# Patient Record
Sex: Female | Born: 1990 | Race: Black or African American | Hispanic: No | Marital: Single
Health system: Southern US, Community
[De-identification: ages and names within clinical notes are randomized; demographics above are authoritative.]

---
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
## 2012-03-04 IMAGING — CT CT HEAD WITHOUT CONTRAST
2 series · 16 of 30 positions shown, 20 images · non-contrast
Comparison: none

REASON FOR EXAM: severe headache
COMMENTS:

PROCEDURE:     CT  - CT HEAD WITHOUT CONTRAST  - November 15, 2010  [DATE]
RESULT:     Technique: Helical 5mm sections were obtained from the skull
base to the vertex without administration of intravenous contrast.

[Series 7: without · axial · non-contrast · 0.41mm/px · z∈[-220,-100]mm · 13 of 28 slices shown, 17 images]
[im 2/28  brain]
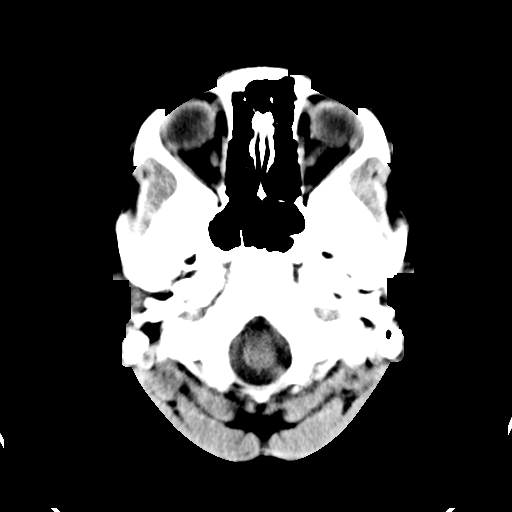
[im 2/28  bone]
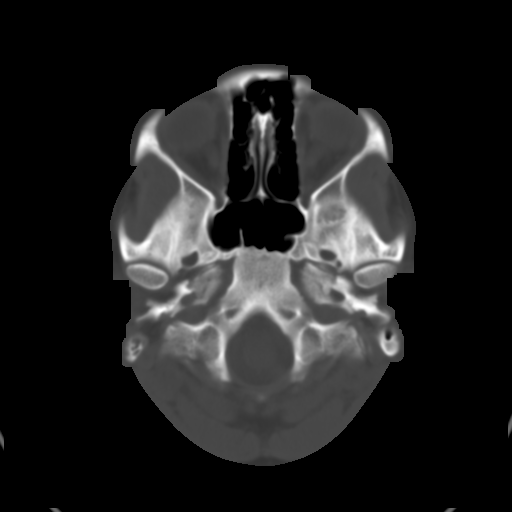
[im 4/28  brain]
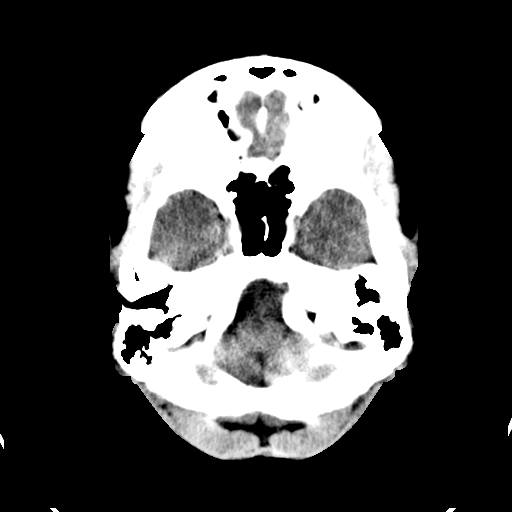
[im 6/28  brain]
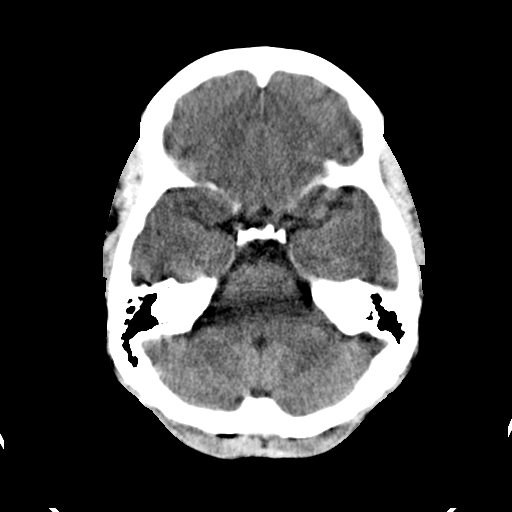
[im 8/28  brain]
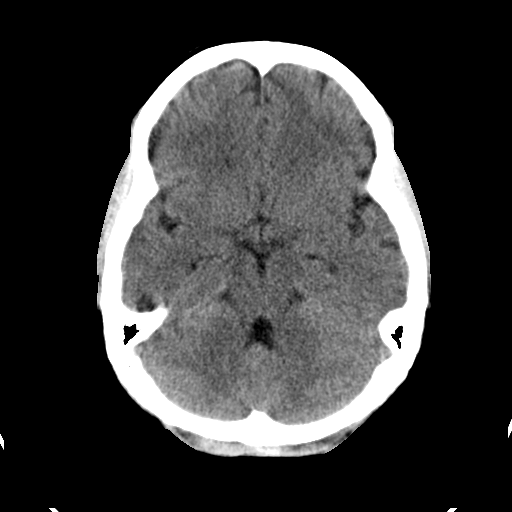
[im 10/28  brain]
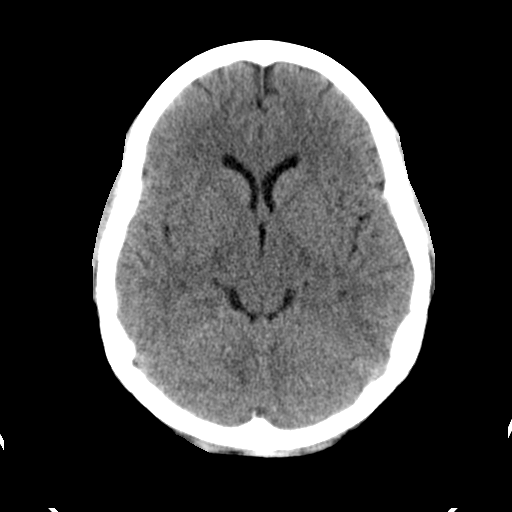
[im 10/28  bone]
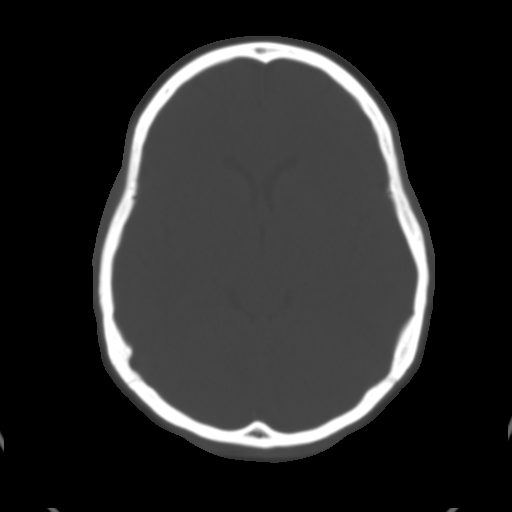
[im 12/28  brain]
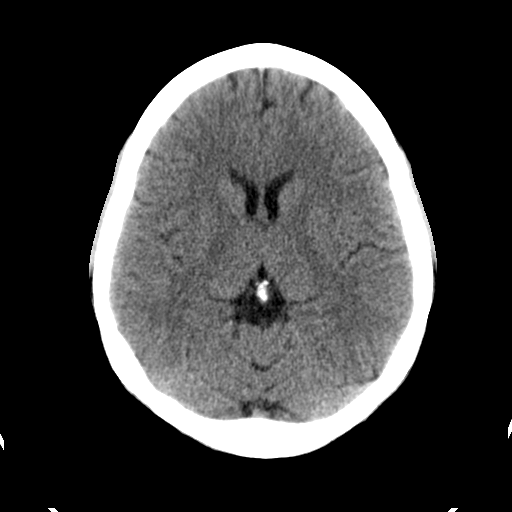
[im 14/28  brain]
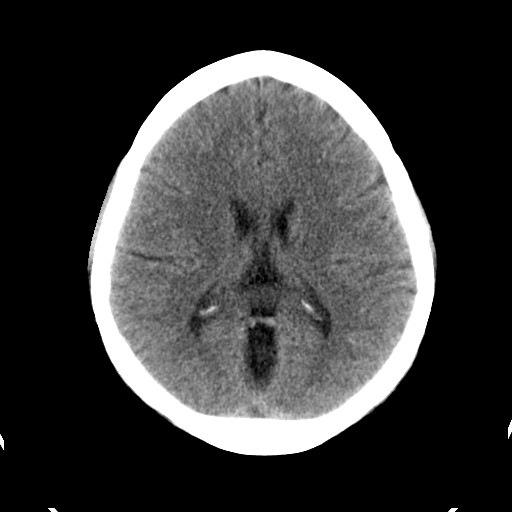
[im 16/28  brain]
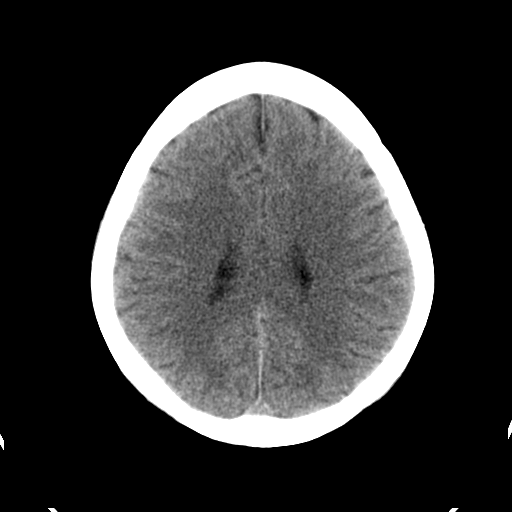
[im 18/28  brain]
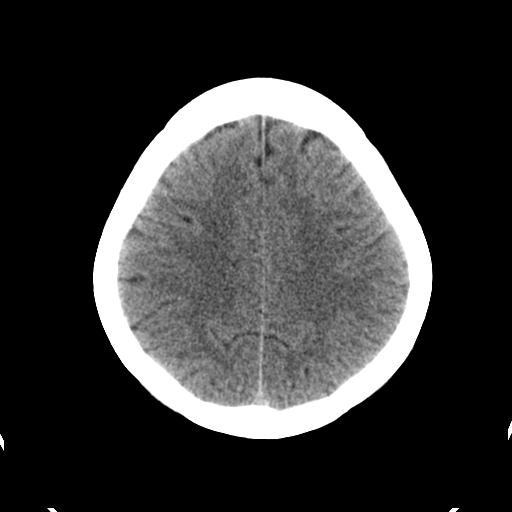
[im 18/28  bone]
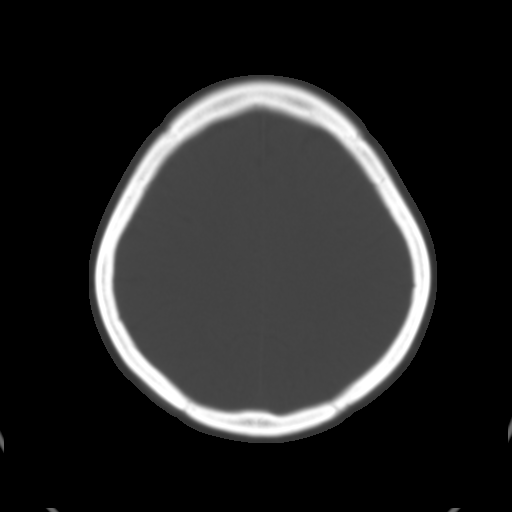
[im 20/28  brain]
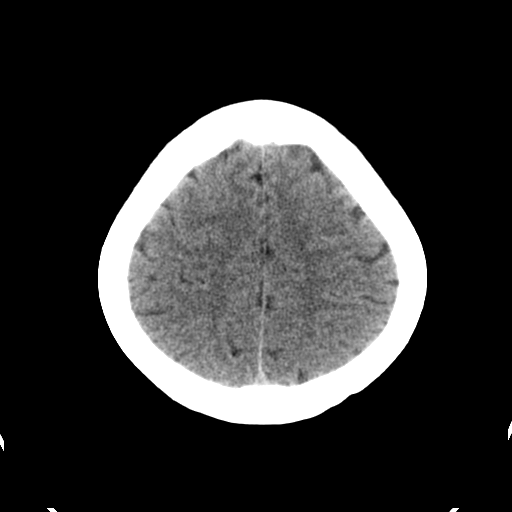
[im 22/28  brain]
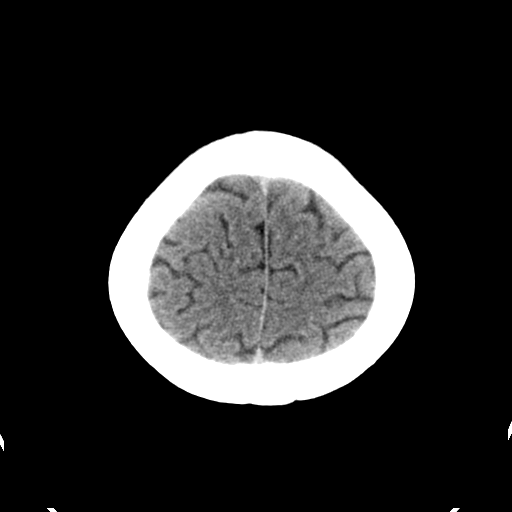
[im 24/28  brain]
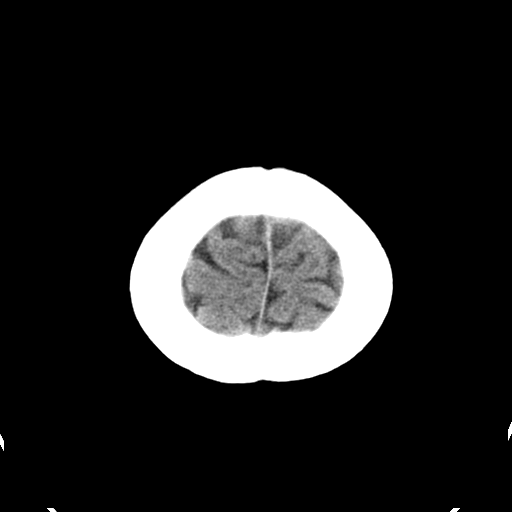
[im 26/28  brain]
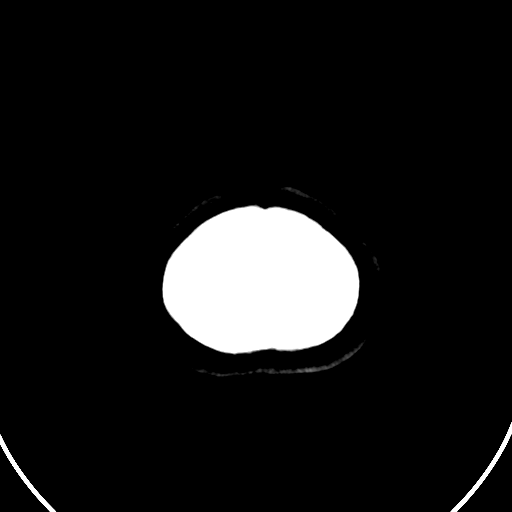
[im 26/28  bone]
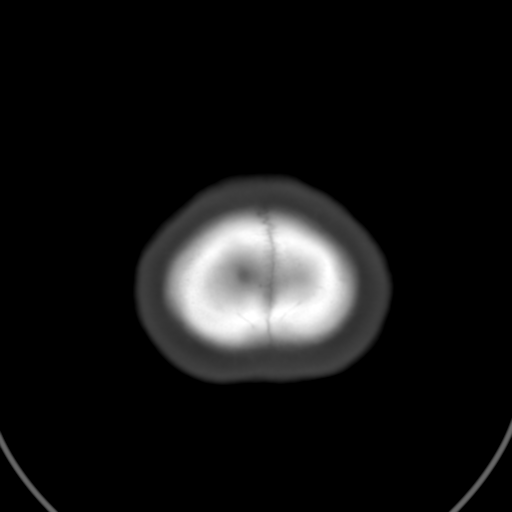

[Series 8: bone · axial · 0.41mm/px · z∈[-220,-180]mm · 3 of 28 slices shown]
[im 2/28  bone]
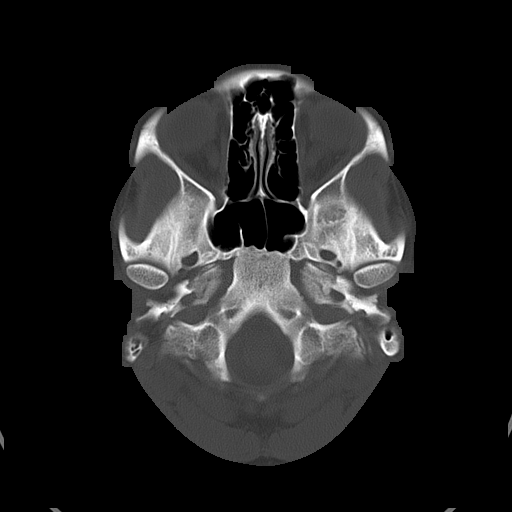
[im 6/28  bone]
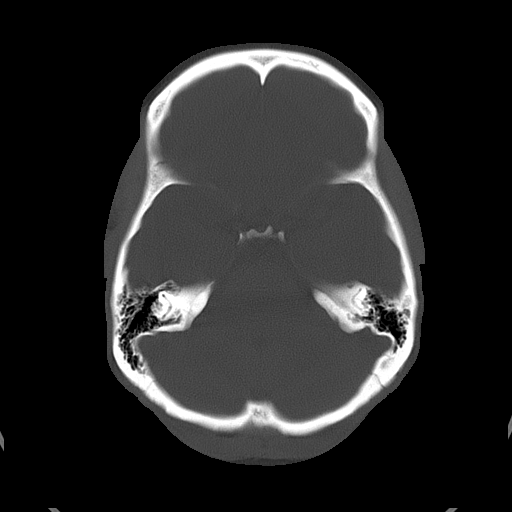
[im 10/28  bone]
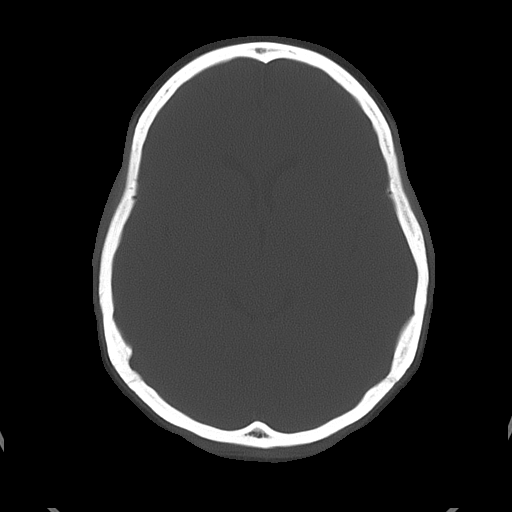

[16 of 30 positions shown; findings below may reference images not displayed]

FINDINGS: There is not evidence of intra-axial fluid collections. There is
no evidence of acute hemorrhage or secondary signs reflecting mass effect or
subacute or chronic focal territorial infarction. The osseous structures
demonstrate no evidence of a depressed skull fracture. If there is
persistent concern clinical follow-up with MRI is recommended.
IMPRESSION: 1. No evidence of acute intracranial abnormalitites.

## 2012-08-15 IMAGING — US US OB US >=[ID] SNGL FETUS
1 series · 17 of 28 positions shown · non-contrast
Comparison: none

REASON FOR EXAM: no fetal heart detected in office
COMMENTS:

[Series 1: us ob us >=(id) sngl fetus · 29 acquisitions, 17 frames shown]
[im 1/29]
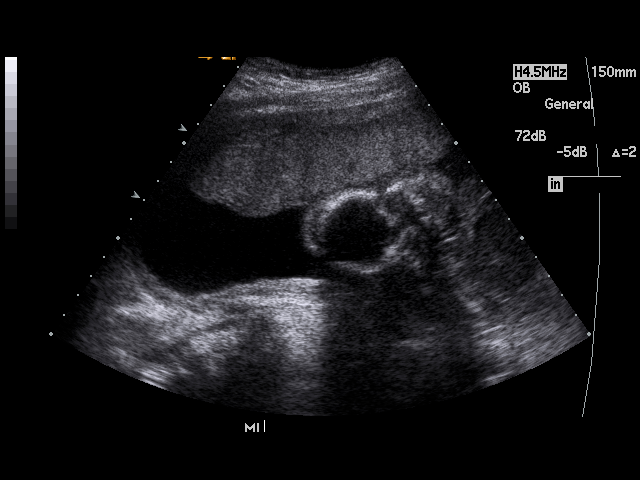
[im 3/29]
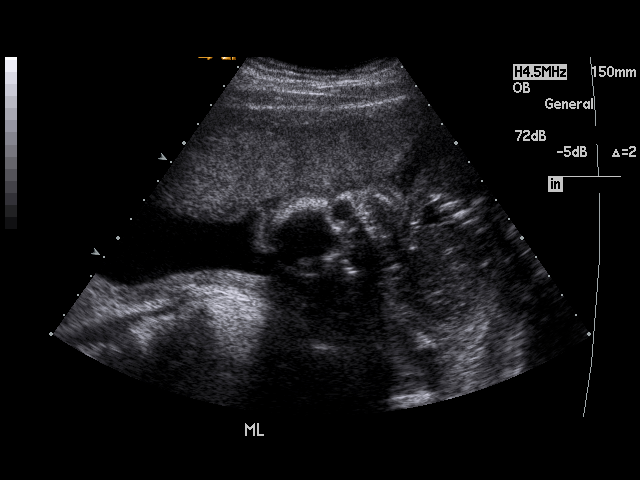
[im 5/29]
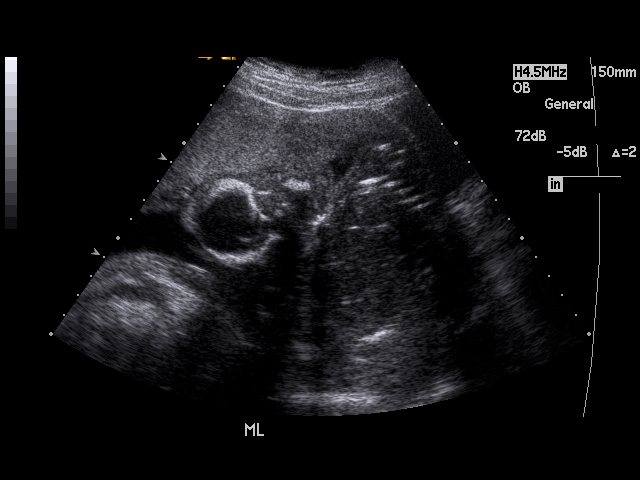
[im 6/29]
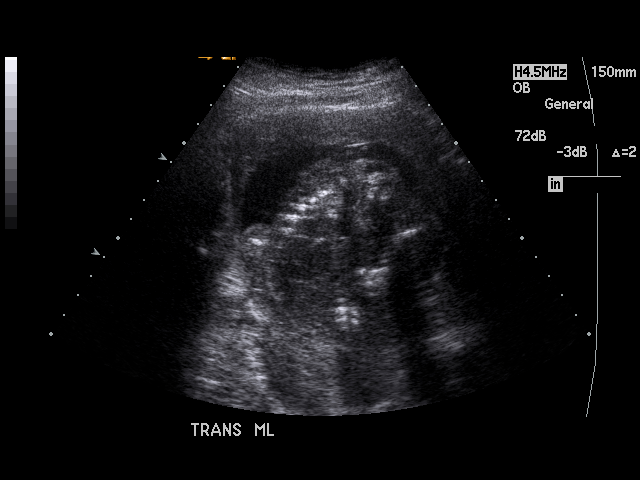
[im 8/29]
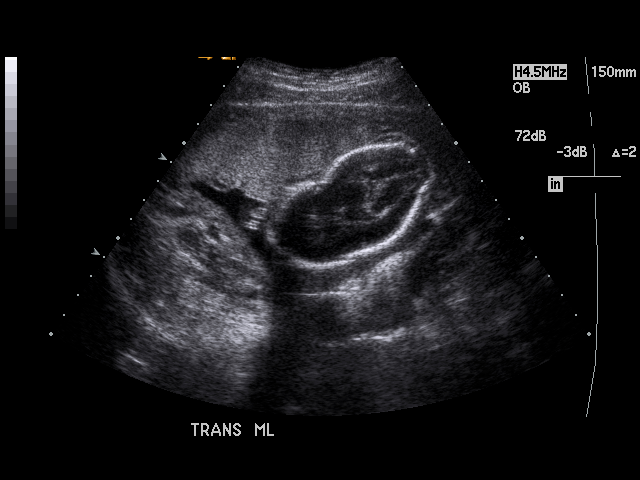
[im 10/29]
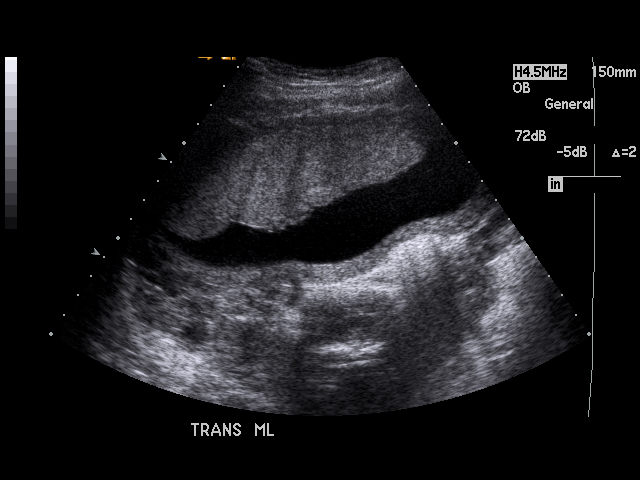
[im 11/29]
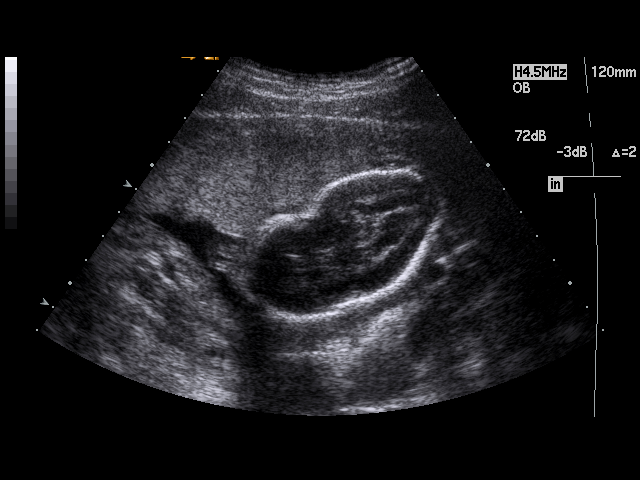
[im 13/29]
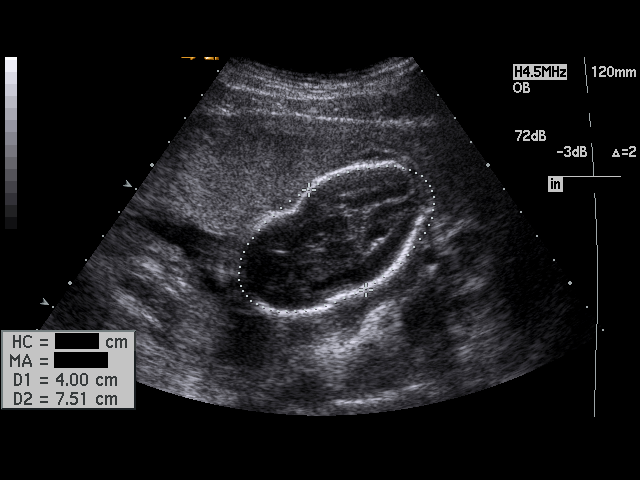
[im 15/29]
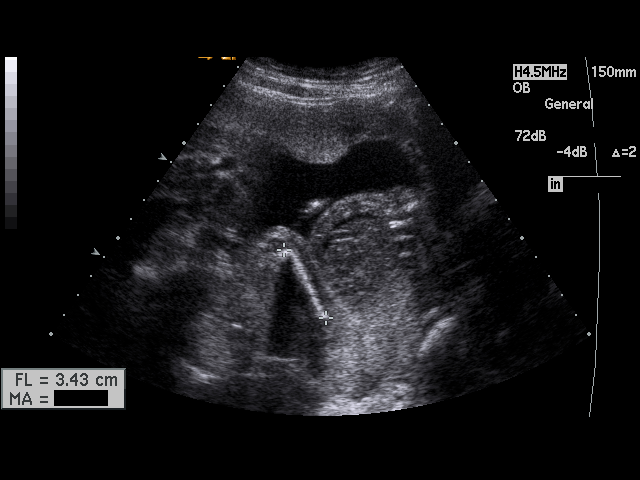
[im 16/29]
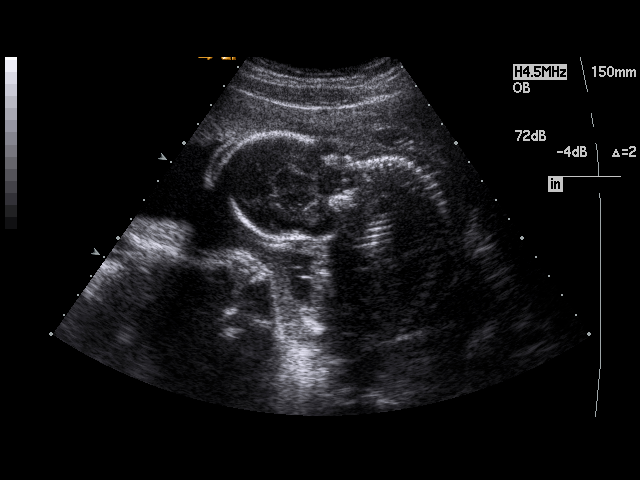
[im 18/29]
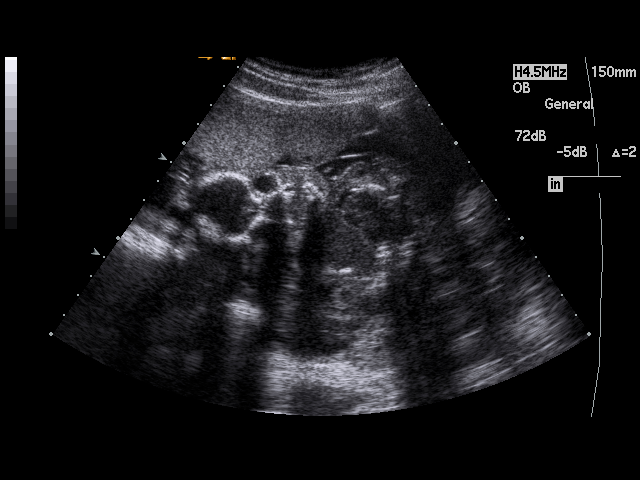
[im 19/29]
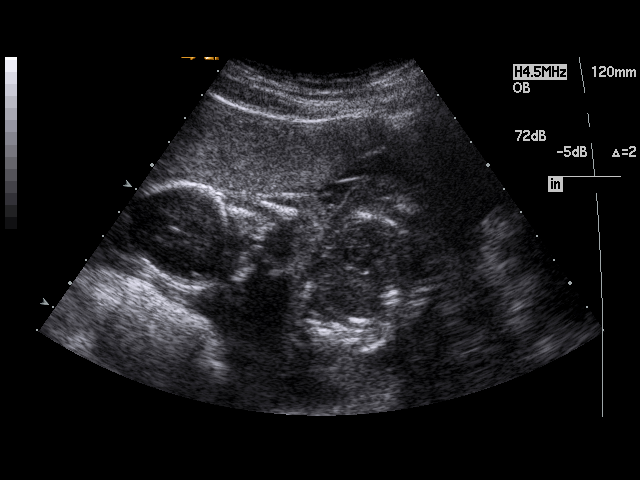
[im 21/29]
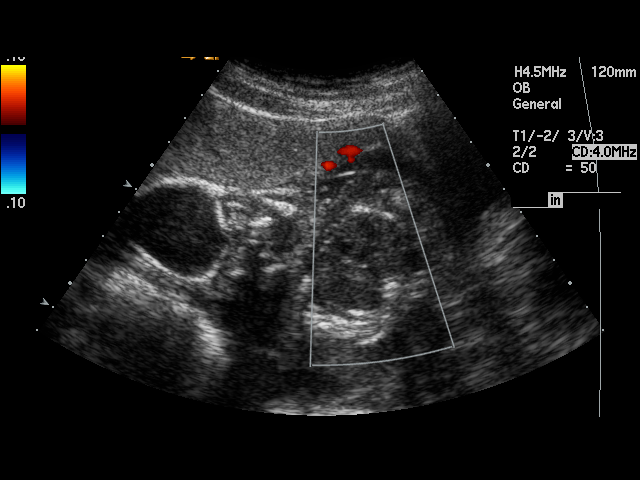
[im 23/29]
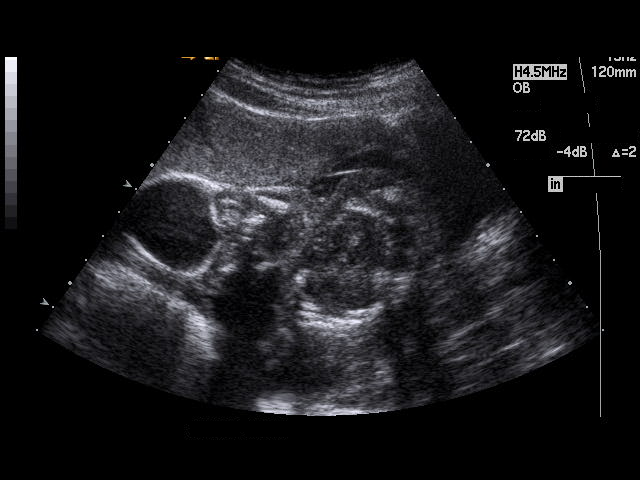
[im 24/29]
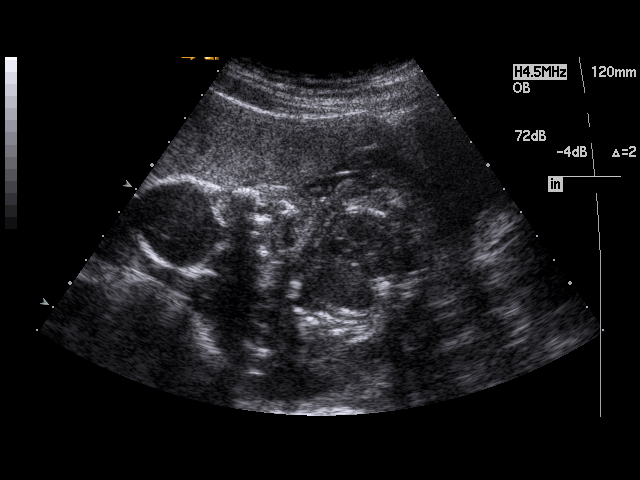
[im 26/29]
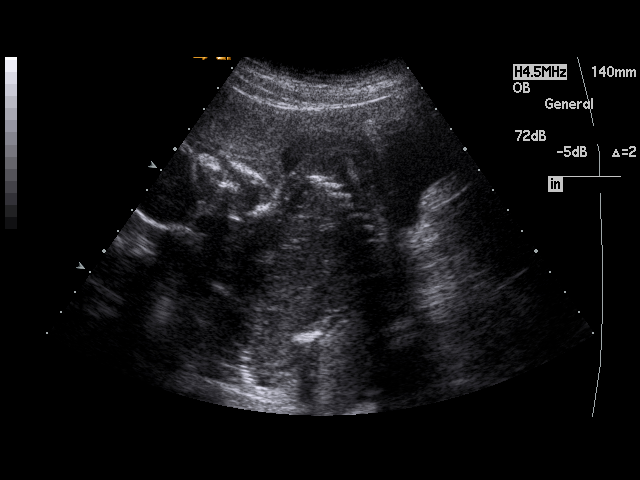
[im 29/29]
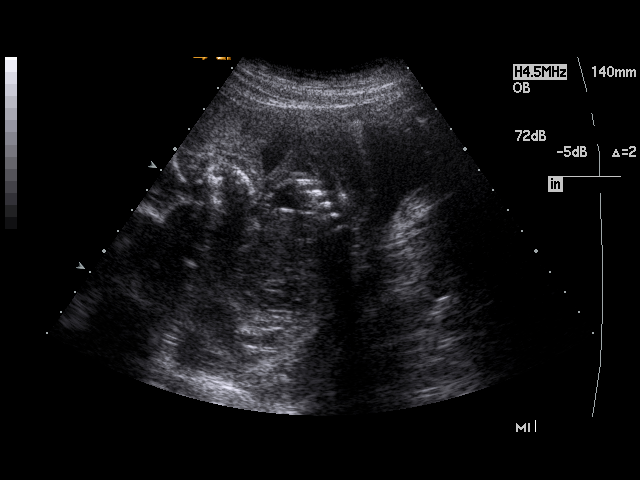

[17 of 28 positions shown; findings below may reference images not displayed]

PROCEDURE:     US  - US OB GREATER/OR EQUAL TO KPDQS  - April 28, 2011  [DATE]

RESULT:     Images demonstrate an intrauterine fetus without cardiac
activity. The anterior placenta is unremarkable. There is a breech
presentation. Gestational age based on fetal measurements is 19 weeks-7 days
+ / - 10 to 13 days.
IMPRESSION: 1.     Findings suggestive of intrauterine fetal demise.

## 2014-07-19 NOTE — Op Note (Signed)
PATIENT NAME:  Kathy EvenerWILKERSON, Liyana MR#:  [id number] DATE OF BIRTH:  01-Jan-1991  DATE OF PROCEDURE:  04/29/2011  PREOPERATIVE DIAGNOSIS: Retained placenta after IUFD.   POSTOPERATIVE DIAGNOSIS: Retained placenta after IUFD.     PROCEDURE:  Suction dilatation and curettage.    SURGEON: Deloris Pinghilip J. Luella Cookosenow, M.D.   ANESTHESIA:  General.  OPERATIVE FINDINGS: Difficult removal.   DESCRIPTION OF PROCEDURE: After adequate general anesthesia, the patient was prepped and draped in routine fashion. The cervix was already opened. A combination of blunt, sharp and suction curettage was performed, ultimately returning all tissue control. Curettage revealed a "clean" field for 360 degrees. The patient tolerated the procedure well and left the Operating Room in good condition. Sponge and needle counts were said to be correct at the end of the procedure. Estimated blood loss 50 mL from surgery.   ____________________________ Deloris PingPhilip J. Luella Cookosenow, MD pjr:ap D: 04/29/2011 01:38:25 ET T: 04/29/2011 12:20:45 ET JOB#: [id number]  cc: Deloris PingPhilip J. Luella Cookosenow, MD, <Dictator> Towana BadgerPHILIP J ROSENOW MD ELECTRONICALLY SIGNED 05/03/2011 5:41
